# Patient Record
Sex: Female | Born: 1950 | Race: White | Hispanic: No | Marital: Married | Smoking: Never smoker
Health system: Southern US, Community
[De-identification: ages and names within clinical notes are randomized; demographics above are authoritative.]

## PROBLEM LIST (undated history)

## (undated) DIAGNOSIS — M1990 Unspecified osteoarthritis, unspecified site: Secondary | ICD-10-CM

## (undated) HISTORY — PX: APPENDECTOMY: SHX54

## (undated) HISTORY — PX: CHOLECYSTECTOMY: SHX55

---

## 2004-02-28 ENCOUNTER — Emergency Department: Payer: Self-pay | Admitting: Unknown Physician Specialty

## 2004-11-06 ENCOUNTER — Ambulatory Visit: Payer: Self-pay | Admitting: Internal Medicine

## 2006-04-01 ENCOUNTER — Ambulatory Visit: Payer: Self-pay | Admitting: Internal Medicine

## 2007-10-04 ENCOUNTER — Ambulatory Visit: Payer: Self-pay | Admitting: Internal Medicine

## 2007-10-05 ENCOUNTER — Ambulatory Visit: Payer: Self-pay | Admitting: Internal Medicine

## 2007-10-08 ENCOUNTER — Ambulatory Visit: Payer: Self-pay | Admitting: Internal Medicine

## 2007-11-21 ENCOUNTER — Other Ambulatory Visit: Payer: Self-pay

## 2007-11-21 ENCOUNTER — Emergency Department: Payer: Self-pay | Admitting: Emergency Medicine

## 2009-08-13 ENCOUNTER — Inpatient Hospital Stay: Payer: Self-pay | Admitting: Surgery

## 2015-10-28 ENCOUNTER — Emergency Department
Admission: EM | Admit: 2015-10-28 | Discharge: 2015-10-28 | Disposition: A | Discharge status: Home / Self Care | Payer: BLUE CROSS/BLUE SHIELD | Attending: Emergency Medicine | Admitting: Emergency Medicine

## 2015-10-28 ENCOUNTER — Encounter: Payer: Self-pay | Admitting: Emergency Medicine

## 2015-10-28 ENCOUNTER — Emergency Department: Payer: BLUE CROSS/BLUE SHIELD

## 2015-10-28 DIAGNOSIS — L03211 Cellulitis of face: Secondary | ICD-10-CM | POA: Diagnosis not present

## 2015-10-28 DIAGNOSIS — K0889 Other specified disorders of teeth and supporting structures: Secondary | ICD-10-CM | POA: Diagnosis present

## 2015-10-28 DIAGNOSIS — K047 Periapical abscess without sinus: Secondary | ICD-10-CM | POA: Insufficient documentation

## 2015-10-28 LAB — BASIC METABOLIC PANEL
Anion gap: 6 (ref 5–15)
BUN: 14 mg/dL (ref 6–20)
CALCIUM: 9 mg/dL (ref 8.9–10.3)
CHLORIDE: 106 mmol/L (ref 101–111)
CO2: 26 mmol/L (ref 22–32)
CREATININE: 0.71 mg/dL (ref 0.44–1.00)
GFR calc non Af Amer: >60 mL/min (ref >60)
Glucose, Bld: 143 mg/dL — ABNORMAL HIGH (ref 65–99)
Potassium: 4.4 mmol/L (ref 3.5–5.1)
SODIUM: 138 mmol/L (ref 135–145)

## 2015-10-28 LAB — CBC WITH DIFFERENTIAL/PLATELET
BASOS ABS: 0 10*3/uL (ref 0–0.1)
BASOS PCT: 0 %
EOS ABS: 0 10*3/uL (ref 0–0.7)
Eosinophils Relative: 0 %
HCT: 43.2 % (ref 35.0–47.0)
HEMOGLOBIN: 15.1 g/dL (ref 12.0–16.0)
LYMPHS ABS: 1.5 10*3/uL (ref 1.0–3.6)
Lymphocytes Relative: 13 %
MCH: 31.6 pg (ref 26.0–34.0)
MCHC: 34.9 g/dL (ref 32.0–36.0)
MCV: 90.6 fL (ref 80.0–100.0)
Monocytes Absolute: 0.8 10*3/uL (ref 0.2–0.9)
Monocytes Relative: 8 %
NEUTROS PCT: 79 %
Neutro Abs: 8.5 10*3/uL — ABNORMAL HIGH (ref 1.4–6.5)
Platelets: 192 10*3/uL (ref 150–440)
RBC: 4.77 MIL/uL (ref 3.80–5.20)
RDW: 13 % (ref 11.5–14.5)
WBC: 10.8 10*3/uL (ref 3.6–11.0)

## 2015-10-28 MED ORDER — CLINDAMYCIN PHOSPHATE 600 MG/50ML IV SOLN
600.0000 mg | Freq: Once | INTRAVENOUS | Status: AC
Start: 2015-10-28 — End: 2015-10-28
  Administered 2015-10-28: 600 mg via INTRAVENOUS
  Filled 2015-10-28: qty 50

## 2015-10-28 MED ORDER — HYDROCODONE-ACETAMINOPHEN 5-325 MG PO TABS: 1.0000 | ORAL_TABLET | Freq: Four times a day (QID) | ORAL | 0 refills | Status: DC | PRN

## 2015-10-28 MED ORDER — IOPAMIDOL (ISOVUE-300) INJECTION 61%: 75.0000 mL | Freq: Once | INTRAVENOUS | Status: DC | PRN

## 2015-10-28 MED ORDER — CLINDAMYCIN HCL 300 MG PO CAPS: 300.0000 mg | ORAL_CAPSULE | Freq: Three times a day (TID) | ORAL | 0 refills | Status: DC

## 2015-10-28 MED ORDER — ONDANSETRON HCL 4 MG/2ML IJ SOLN
4.0000 mg | INTRAMUSCULAR | Status: AC
  Administered 2015-10-28: 4 mg via INTRAVENOUS
  Filled 2015-10-28: qty 2

## 2015-10-28 MED ORDER — MORPHINE SULFATE (PF) 4 MG/ML IV SOLN
4.0000 mg | Freq: Once | INTRAVENOUS | Status: AC
  Administered 2015-10-28: 4 mg via INTRAVENOUS
  Filled 2015-10-28: qty 1

## 2015-10-28 MED ORDER — DEXAMETHASONE SODIUM PHOSPHATE 10 MG/ML IJ SOLN
10.0000 mg | Freq: Once | INTRAMUSCULAR | Status: AC
  Administered 2015-10-28: 10 mg via INTRAVENOUS
  Filled 2015-10-28: qty 1

## 2015-10-28 MED ORDER — SODIUM CHLORIDE 0.9 % IV BOLUS (SEPSIS)
500.0000 mL | INTRAVENOUS | Status: AC
  Administered 2015-10-28: 500 mL via INTRAVENOUS

## 2015-10-28 NOTE — ED Triage Notes (Signed)
Pt reports top right toothache pain with swelling and redness to right cheek; pt says she has broken teeth; pain started yesterday; pt provided with dental clinic/resources in the community in triage;

## 2015-10-28 NOTE — Discharge Instructions (Signed)
Please follow up with a dentist within 24-48 hours for recheck and treatment of your dental infection.  Call your doctor sooner or return to the ED if you develop worsening signs of infection such as: increased redness, increased pain, pus, fever, or other symptoms that concern you.

## 2015-10-28 NOTE — ED Provider Notes (Signed)
[facility] Emergency Department Provider Note  ____________________________________________   None    (approximate)  I have reviewed the triage vital signs and the nursing notes.   HISTORY  Chief Complaint Dental Pain    HPI Jessica Daugherty is a 65 y.o. female with a history of multiple broken teeth and poor dental hygiene (and no dentist) who presents with gradual onset but rapidly worsening pain in swelling in her right cheek that seems to be coming from one of her teeth on the right upper side.  She reports that it started about 24 hours ago and at first was relatively minor and mild but overnight has gotten significantly worse.  The right side of her face is visibly swollen and her cheek is red.  She also feels like her sinuses on the right side are bothering her and they feel full.  She describes the pain is severe and aching and throbbing.  Trying to eat makes it worse and nothing makes it better.  Her vision is not compromised but she is having some watering from her right eye.  There is no swelling around her eyes.  She denies fever/chills, chest pain, shortness of breath.  She has had some nausea but no vomiting.  She denies abdominal pain and dysuria.  Her voice has not changed and she is not having any trouble swallowing.  She has had no swelling of her tongue or her lips.   History reviewed. No pertinent past medical history.  There are no active problems to display for this patient.   Past Surgical History:  Procedure Laterality Date  . APPENDECTOMY    . CESAREAN SECTION    . CHOLECYSTECTOMY      Prior to Admission medications   Not on File    Allergies Review of patient's allergies indicates no known allergies.  History reviewed. No pertinent family history.  Social History Social History  Substance Use Topics  . Smoking status: Never Smoker  . Smokeless tobacco: Never Used  . Alcohol use No    Review of Systems Constitutional:  No fever/chills Eyes: No visual changes. Some watering of her right eye ENT: No sore throat. The sinuses and the right side of her face feel full and somewhat painful.  She has swelling and pain to her entire right cheek that is gotten significantly worse overnight and is tender to the touch and is coming from some dental problems in the right upper teeth Cardiovascular: Denies chest pain. Respiratory: Denies shortness of breath. Gastrointestinal: No abdominal pain.  nausea, no vomiting.  No diarrhea.  No constipation. Genitourinary: Negative for dysuria. Musculoskeletal: Negative for back pain. Skin: Negative for rash. Neurological: Negative for headaches, focal weakness or numbness.  10-point ROS otherwise negative.  ____________________________________________   PHYSICAL EXAM:  VITAL SIGNS: ED Triage Vitals  Enc Vitals Group     BP 10/28/15 0609 (!) 158/73     Pulse Rate 10/28/15 0609 93     Resp 10/28/15 0609 18     Temp 10/28/15 0609 98 F (36.7 C)     Temp Source 10/28/15 0609 Oral     SpO2 10/28/15 0609 95 %     Weight 10/28/15 0607 300 lb (136.1 kg)     Height 10/28/15 0607 5\' 3"  (1.6 m)     Head Circumference --      Peak Flow --      Pain Score 10/28/15 0607 10     Pain Loc --  Pain Edu? --      Excl. in GC? --     Constitutional: Alert and oriented. No acute distress. Eyes: Conjunctivae are normal except for some excessive lacrimation from the right eye. PERRL. EOMI. Head: Atraumatic. Nose: No congestion/rhinnorhea. Mouth/Throat: Numerous chronic dental caries throughout her mouth.  She has numerous missing teeth as well but to the best my estimation tooth #6 is the source of the issue; she has Some swelling on the external part of her gums and it is exquisitely tender to palpation.  Her right cheek is thickened, edematous, tender, and erythematous, all consistent with facial cellulitis at a minimum.  The tenderness extends in her face to her right bone but  she does not have any evidence of periorbital cellulitis.  Mucous membranes are moist.  Oropharynx non-erythematous.  No brawny induration under the mandible and no tenderness, thickening, or firmness under the tongue that would suggest Ludwig's angina. Neck: No stridor.  No meningeal signs.   Cardiovascular: Normal rate, regular rhythm. Good peripheral circulation. Grossly normal heart sounds. Respiratory: Normal respiratory effort.  No retractions. Lungs CTAB. Gastrointestinal: Soft and nontender. No distention.  Musculoskeletal: No lower extremity tenderness nor edema. No gross deformities of extremities. Neurologic:  Normal speech and language. No gross focal neurologic deficits are appreciated.  Skin:  Skin is warm, dry and intact. No rash noted. Psychiatric: Mood and affect are normal. Speech and behavior are normal.  ____________________________________________   LABS (all labs ordered are listed, but only abnormal results are displayed)  CBC with differential and BMP are pending  ____________________________________________  EKG  None - EKG not ordered by ED physician ____________________________________________  RADIOLOGY  CT maxillofacial with IV contrast is pending  ____________________________________________   PROCEDURES  Procedure(s) performed:   Procedures   Critical Care performed: No ____________________________________________   INITIAL IMPRESSION / ASSESSMENT AND PLAN / ED COURSE  Pertinent labs & imaging results that were available during my care of the patient were reviewed by me and considered in my medical decision making (see chart for details).  At a minimum the patient has facial cellulitis from a probable dental source, but I feel she needs further imaging to make sure she does not have a deep tissue infection/abscess.  I am treating empirically with clindamycin 600 mg IV and Decadron 10 mg IV and a small fluid bolus.  Transferring ED care to  Dr. Fanny BienQuale at 7:15am to follow up labs and CT scan to determine appropriate disposition.   ____________________________________________  FINAL CLINICAL IMPRESSION(S) / ED DIAGNOSES  Facial swelling, right cheek Probable facial cellulitis Multiple chronic dental caries   MEDICATIONS GIVEN DURING THIS VISIT:  Medications  clindamycin (CLEOCIN) IVPB 600 mg   dexamethasone (DECADRON) injection 10 mg   morphine 4 MG/ML injection 4 mg   ondansetron (ZOFRAN) injection 4 mg   sodium chloride 0.9 % bolus 500 mL         Note:  This document was prepared using Dragon voice recognition software and may include unintentional dictation errors.    Loleta Roseory Azaria Stegman, MD 10/28/15 [id number]

## 2015-10-28 NOTE — ED Notes (Signed)
Pt verbalized understanding of discharge instructions. NAD at this time. 

## 2015-10-28 NOTE — ED Notes (Signed)
Patient transported to CT 

## 2015-10-28 NOTE — ED Notes (Signed)
Per EDP direction acuity change

## 2015-10-28 NOTE — ED Provider Notes (Signed)
Ct Maxillofacial W Contrast  Result Date: 10/28/2015 CLINICAL DATA:  Right upper tooth pain.  Swelling and right cheek. EXAM: CT MAXILLOFACIAL WITH CONTRAST TECHNIQUE: Multidetector CT imaging of the maxillofacial structures was performed with intravenous contrast. Multiplanar CT image reconstructions were also generated. A small metallic BB was placed on the right temple in order to reliably differentiate right from left. CONTRAST:  75 cc Isovue 300 IV COMPARISON:  None. FINDINGS: Mild stranding in the right facial soft tissues compatible with cellulitis. No focal fluid collection to suggest soft tissue abscess. Mucosal thickening in the right maxillary sinus. Paranasal sinuses otherwise clear. Mastoid air cells are clear. Lucency noted around bilateral upper premolars and the right upper remaining molar compatible periapical abscess. Multiple dental caries. IMPRESSION: Periapical abscesses involving the upper premolars and remaining right upper molar. Multiple dental caries. Cellulitis changes in the right face without soft tissue abscess. Electronically Signed   By: Charlett NoseKevin  Dover M.D.   On: 10/28/2015 08:56     Patient resting comfortably. Airway widely patent. Reports pain is controlled, and we discussed treatment including follow-up options for dentistry. She'll be calling Monday to set up close follow-up and understands that she needs dental evaluation for a "abscess". All place her on clindamycin, also provided a prescription for hydrocodone. I will prescribe the patient a narcotic pain medicine due to their condition which I anticipate will cause at least moderate pain short term. I discussed with the patient safe use of narcotic pain medicines, and that they are not to drive, work in dangerous areas, or ever take more than prescribed (no more than 1 pill every 6 hours). We discussed that this is the type of medication that can be  overdosed on and the risks of this type of medicine. Patient is very  agreeable to only use as prescribed and to never use more than prescribed.  Alert, no distress, no evidence of deep infection such as Ludwig's. Patient appears appropriate for outpatient treatment. Return precautions and treatment recommendations and follow-up discussed with the patient who is agreeable with the plan.    Sharyn CreamerMark Shavette Shoaff, MD 10/28/15 [phone]

## 2016-04-11 NOTE — Discharge Instructions (Signed)
Cataract Surgery, Care After °Refer to this sheet in the next few weeks. These instructions provide you with information about caring for yourself after your procedure. Your health care provider may also give you more specific instructions. Your treatment has been planned according to current medical practices, but problems sometimes occur. Call your health care provider if you have any problems or questions after your procedure. °What can I expect after the procedure? °After the procedure, it is common to have: °· Itching. °· Discomfort. °· Fluid discharge. °· Sensitivity to light and to touch. °· Bruising. °Follow these instructions at home: °Eye Care  °· Check your eye every day for signs of infection. Watch for: °¨ Redness, swelling, or pain. °¨ Fluid, blood, or pus. °¨ Warmth. °¨ Bad smell. °Activity  °· Avoid strenuous activities, such as playing contact sports, for as long as told by your health care provider. °· Do not drive or operate heavy machinery until your health care provider approves. °· Do not bend or lift heavy objects . Bending increases pressure in the eye. You can walk, climb stairs, and do light household chores. °· Ask your health care provider when you can return to work. If you work in a dusty environment, you may be advised to wear protective eyewear for a period of time. °General instructions  °· Take or apply over-the-counter and prescription medicines only as told by your health care provider. This includes eye drops. °· Do not touch or rub your eyes. °· If you were given a protective shield, wear it as told by your health care provider. If you were not given a protective shield, wear sunglasses as told by your health care provider to protect your eyes. °· Keep the area around your eye clean and dry. Avoid swimming or allowing water to hit you directly in the face while showering until told by your health care provider. Keep soap and shampoo out of your eyes. °· Do not put a contact lens  into the affected eye or eyes until your health care provider approves. °· Keep all follow-up visits as told by your health care provider. This is important. °Contact a health care provider if: ° °· You have increased bruising around your eye. °· You have pain that is not helped with medicine. °· You have a fever. °· You have redness, swelling, or pain in your eye. °· You have fluid, blood, or pus coming from your incision. °· Your vision gets worse. °Get help right away if: °· You have sudden vision loss. °This information is not intended to replace advice given to you by your health care provider. Make sure you discuss any questions you have with your health care provider. °Document Released: 09/06/2004 Document Revised: 06/28/2015 Document Reviewed: 12/28/2014 °Elsevier Interactive Patient Education © 2017 [employer] ° ° ° ° °General Anesthesia, Adult, Care After °These instructions provide you with information about caring for yourself after your procedure. Your health care provider may also give you more specific instructions. Your treatment has been planned according to current medical practices, but problems sometimes occur. Call your health care provider if you have any problems or questions after your procedure. °What can I expect after the procedure? °After the procedure, it is common to have: °· Vomiting. °· A sore throat. °· Mental slowness. °It is common to feel: °· Nauseous. °· Cold or shivery. °· Sleepy. °· Tired. °· Sore or achy, even in parts of your body where you did not have surgery. °Follow these instructions at   home: °For at least 24 hours after the procedure:  °· Do not: °¨ Participate in activities where you could fall or become injured. °¨ Drive. °¨ Use heavy machinery. °¨ Drink alcohol. °¨ Take sleeping pills or medicines that cause drowsiness. °¨ Make important decisions or sign legal documents. °¨ Take care of children on your own. °· Rest. °Eating and drinking  °· If you vomit, drink  water, juice, or soup when you can drink without vomiting. °· Drink enough fluid to keep your urine clear or pale yellow. °· Make sure you have little or no nausea before eating solid foods. °· Follow the diet recommended by your health care provider. °General instructions  °· Have a responsible adult stay with you until you are awake and alert. °· Return to your normal activities as told by your health care provider. Ask your health care provider what activities are safe for you. °· Take over-the-counter and prescription medicines only as told by your health care provider. °· If you smoke, do not smoke without supervision. °· Keep all follow-up visits as told by your health care provider. This is important. °Contact a health care provider if: °· You continue to have nausea or vomiting at home, and medicines are not helpful. °· You cannot drink fluids or start eating again. °· You cannot urinate after 8-12 hours. °· You develop a skin rash. °· You have fever. °· You have increasing redness at the site of your procedure. °Get help right away if: °· You have difficulty breathing. °· You have chest pain. °· You have unexpected bleeding. °· You feel that you are having a life-threatening or urgent problem. °This information is not intended to replace advice given to you by your health care provider. Make sure you discuss any questions you have with your health care provider. °Document Released: 05/26/2000 Document Revised: 07/23/2015 Document Reviewed: 02/01/2015 °Elsevier Interactive Patient Education © 2017 [employer] ° °

## 2016-04-14 ENCOUNTER — Encounter: Payer: Self-pay | Admitting: *Deleted

## 2016-04-16 ENCOUNTER — Ambulatory Visit: Payer: Medicare PPO | Admitting: Anesthesiology

## 2016-04-16 ENCOUNTER — Ambulatory Visit
Admission: RE | Admit: 2016-04-16 | Discharge: 2016-04-16 | Disposition: A | Discharge status: Home / Self Care | Payer: Medicare PPO | Source: Ambulatory Visit | Attending: Ophthalmology | Admitting: Ophthalmology

## 2016-04-16 ENCOUNTER — Encounter
Admission: RE | Disposition: A | Discharge status: Home / Self Care | Payer: Self-pay | Source: Ambulatory Visit | Attending: Ophthalmology

## 2016-04-16 DIAGNOSIS — H2511 Age-related nuclear cataract, right eye: Secondary | ICD-10-CM | POA: Insufficient documentation

## 2016-04-16 HISTORY — DX: Unspecified osteoarthritis, unspecified site: M19.90

## 2016-04-16 HISTORY — PX: CATARACT EXTRACTION W/PHACO: SHX586

## 2016-04-16 SURGERY — PHACOEMULSIFICATION, CATARACT, WITH IOL INSERTION
Anesthesia: Monitor Anesthesia Care | Site: Eye | Laterality: Right | Wound class: Clean

## 2016-04-16 MED ORDER — FENTANYL CITRATE (PF) 100 MCG/2ML IJ SOLN
INTRAMUSCULAR | Status: DC | PRN
  Administered 2016-04-16: 50 ug via INTRAVENOUS

## 2016-04-16 MED ORDER — LACTATED RINGERS IV SOLN: 500.0000 mL | INTRAVENOUS | Status: DC

## 2016-04-16 MED ORDER — ACETAMINOPHEN 160 MG/5ML PO SOLN: 325.0000 mg | ORAL | Status: DC | PRN

## 2016-04-16 MED ORDER — CEFUROXIME OPHTHALMIC INJECTION 1 MG/0.1 ML
INJECTION | OPHTHALMIC | Status: DC | PRN
  Administered 2016-04-16: .3 mL via OPHTHALMIC

## 2016-04-16 MED ORDER — MIDAZOLAM HCL 2 MG/2ML IJ SOLN
INTRAMUSCULAR | Status: DC | PRN
Start: 2016-04-16 — End: 2016-04-16
  Administered 2016-04-16: 2 mg via INTRAVENOUS

## 2016-04-16 MED ORDER — NA HYALUR & NA CHOND-NA HYALUR 0.4-0.35 ML IO KIT
PACK | INTRAOCULAR | Status: DC | PRN
  Administered 2016-04-16: 1 mL via INTRAOCULAR

## 2016-04-16 MED ORDER — ACETAMINOPHEN 325 MG PO TABS: 325.0000 mg | ORAL_TABLET | ORAL | Status: DC | PRN

## 2016-04-16 MED ORDER — MOXIFLOXACIN HCL 0.5 % OP SOLN
1.0000 [drp] | OPHTHALMIC | Status: DC | PRN
  Administered 2016-04-16 (×3): 1 [drp] via OPHTHALMIC

## 2016-04-16 MED ORDER — EPINEPHRINE PF 1 MG/ML IJ SOLN
INTRAMUSCULAR | Status: DC | PRN
  Administered 2016-04-16: 63 mL via OPHTHALMIC

## 2016-04-16 MED ORDER — BRIMONIDINE TARTRATE-TIMOLOL 0.2-0.5 % OP SOLN
OPHTHALMIC | Status: DC | PRN
  Administered 2016-04-16: 1 [drp] via OPHTHALMIC

## 2016-04-16 MED ORDER — ARMC OPHTHALMIC DILATING DROPS
1.0000 | OPHTHALMIC | Status: DC | PRN
Start: 2016-04-16 — End: 2016-04-16
  Administered 2016-04-16 (×3): 1 via OPHTHALMIC

## 2016-04-16 MED ORDER — LIDOCAINE HCL (PF) 2 % IJ SOLN
INTRAMUSCULAR | Status: DC | PRN
  Administered 2016-04-16: 2 mL via INTRAOCULAR

## 2016-04-16 MED ORDER — LACTATED RINGERS IV SOLN: INTRAVENOUS | Status: DC

## 2016-04-16 SURGICAL SUPPLY — 25 items
CANNULA ANT/CHMB 27GA (MISCELLANEOUS) ×3 IMPLANT
CARTRIDGE ABBOTT (MISCELLANEOUS) IMPLANT
GLOVE SURG LX 7.5 STRW (GLOVE) ×2
GLOVE SURG LX STRL 7.5 STRW (GLOVE) ×1 IMPLANT
GLOVE SURG TRIUMPH 8.0 PF LTX (GLOVE) ×3 IMPLANT
GOWN STRL REUS W/ TWL LRG LVL3 (GOWN DISPOSABLE) ×2 IMPLANT
GOWN STRL REUS W/TWL LRG LVL3 (GOWN DISPOSABLE) ×4
LENS IOL TECNIS ITEC 15.0 (Intraocular Lens) ×3 IMPLANT
MARKER SKIN DUAL TIP RULER LAB (MISCELLANEOUS) ×3 IMPLANT
NDL RETROBULBAR .5 NSTRL (NEEDLE) IMPLANT
NEEDLE FILTER BLUNT 18X 1/2SAF (NEEDLE) ×2
NEEDLE FILTER BLUNT 18X1 1/2 (NEEDLE) ×1 IMPLANT
PACK CATARACT BRASINGTON (MISCELLANEOUS) ×3 IMPLANT
PACK EYE AFTER SURG (MISCELLANEOUS) ×3 IMPLANT
PACK OPTHALMIC (MISCELLANEOUS) ×3 IMPLANT
RING MALYGIN 7.0 (MISCELLANEOUS) IMPLANT
SUT ETHILON 10-0 CS-B-6CS-B-6 (SUTURE)
SUT VICRYL  9 0 (SUTURE)
SUT VICRYL 9 0 (SUTURE) IMPLANT
SUTURE EHLN 10-0 CS-B-6CS-B-6 (SUTURE) IMPLANT
SYR 3ML LL SCALE MARK (SYRINGE) ×3 IMPLANT
SYR 5ML LL (SYRINGE) ×3 IMPLANT
SYR TB 1ML LUER SLIP (SYRINGE) ×3 IMPLANT
WATER STERILE IRR 250ML POUR (IV SOLUTION) ×3 IMPLANT
WIPE NON LINTING 3.25X3.25 (MISCELLANEOUS) ×3 IMPLANT

## 2016-04-16 NOTE — Op Note (Signed)
LOCATION:  [facility]   PREOPERATIVE DIAGNOSIS:    Nuclear sclerotic cataract right eye. H25.11   POSTOPERATIVE DIAGNOSIS:  Nuclear sclerotic cataract right eye.     PROCEDURE:  Phacoemusification with posterior chamber intraocular lens placement of the right eye   LENS:   Implant Name Type Inv. Item Serial No. Manufacturer Lot No. LRB No. Used  LENS IOL DIOP 15.0 - [id number] Intraocular Lens LENS IOL DIOP 15.0 [id number]   Right 1        ULTRASOUND TIME: 17 % of 1 minutes, 6 seconds.  CDE 11.6   SURGEON:  Deirdre Evenerhadwick R. Junior Huezo, MD   ANESTHESIA:  Topical with tetracaine drops and 2% Xylocaine jelly, augmented with 1% preservative-free intracameral lidocaine.    COMPLICATIONS:  None.   DESCRIPTION OF PROCEDURE:  The patient was identified in the holding room and transported to the operating room and placed in the supine position under the operating microscope.  The right eye was identified as the operative eye and it was prepped and draped in the usual sterile ophthalmic fashion.   A 1 millimeter clear-corneal paracentesis was made at the 12:00 position.  0.5 ml of preservative-free 1% lidocaine was injected into the anterior chamber. The anterior chamber was filled with Viscoat viscoelastic.  A 2.4 millimeter keratome was used to make a near-clear corneal incision at the 9:00 position.  A curvilinear capsulorrhexis was made with a cystotome and capsulorrhexis forceps.  Balanced salt solution was used to hydrodissect and hydrodelineate the nucleus.   Phacoemulsification was then used in stop and chop fashion to remove the lens nucleus and epinucleus.  The remaining cortex was then removed using the irrigation and aspiration handpiece. Provisc was then placed into the capsular bag to distend it for lens placement.  A lens was then injected into the capsular bag.  The remaining viscoelastic was aspirated.   Wounds were hydrated with balanced salt solution.  The anterior  chamber was inflated to a physiologic pressure with balanced salt solution.  No wound leaks were noted. Cefuroxime 0.1 ml of a 10mg /ml solution was injected into the anterior chamber for a dose of 1 mg of intracameral antibiotic at the completion of the case.   Timolol and Brimonidine drops were applied to the eye.  The patient was taken to the recovery room in stable condition without complications of anesthesia or surgery.   Jessica Daugherty 04/16/2016, 10:16 AM

## 2016-04-16 NOTE — Anesthesia Postprocedure Evaluation (Signed)
Anesthesia Post Note  Patient: Jessica Daugherty  Procedure(s) Performed: Procedure(s) (LRB): CATARACT EXTRACTION PHACO AND INTRAOCULAR LENS PLACEMENT (IOC)  Right (Right)  Patient location during evaluation: PACU Anesthesia Type: MAC Level of consciousness: awake and alert Pain management: pain level controlled Vital Signs Assessment: post-procedure vital signs reviewed and stable Respiratory status: spontaneous breathing, nonlabored ventilation and respiratory function stable Cardiovascular status: stable and blood pressure returned to baseline Anesthetic complications: no    Pearlena Ow D Steph Cheadle

## 2016-04-16 NOTE — H&P (Signed)
The History and Physical notes are on paper, have been signed, and are to be scanned. The patient remains stable and unchanged from the H&P.   Previous H&P reviewed, patient examined, and there are no changes.  Jessica Daugherty 04/16/2016 9:13 AM\

## 2016-04-16 NOTE — Anesthesia Preprocedure Evaluation (Signed)
Anesthesia Evaluation  Patient identified by MRN, date of birth, ID band Patient awake    Reviewed: Allergy & Precautions, H&P , NPO status , Patient's Chart, lab work & pertinent test results, reviewed documented beta blocker date and time   Airway Mallampati: II  TM Distance: >3 FB Neck ROM: full    Dental no notable dental hx.    Pulmonary neg pulmonary ROS,    Pulmonary exam normal breath sounds clear to auscultation       Cardiovascular Exercise Tolerance: Good negative cardio ROS   Rhythm:regular Rate:Normal     Neuro/Psych negative neurological ROS  negative psych ROS   GI/Hepatic negative GI ROS, Neg liver ROS,   Endo/Other  negative endocrine ROS  Renal/GU negative Renal ROS  negative genitourinary   Musculoskeletal   Abdominal   Peds  Hematology negative hematology ROS (+)   Anesthesia Other Findings   Reproductive/Obstetrics negative OB ROS                             Anesthesia Physical Anesthesia Plan  ASA: II  Anesthesia Plan: MAC   Post-op Pain Management:    Induction:   Airway Management Planned:   Additional Equipment:   Intra-op Plan:   Post-operative Plan:   Informed Consent:   Plan Discussed with: CRNA  Anesthesia Plan Comments:         Anesthesia Quick Evaluation  

## 2016-04-16 NOTE — Transfer of Care (Signed)
Immediate Anesthesia Transfer of Care Note  Patient: Jessica Daugherty  Procedure(s) Performed: Procedure(s): CATARACT EXTRACTION PHACO AND INTRAOCULAR LENS PLACEMENT (IOC)  Right (Right)  Patient Location: PACU  Anesthesia Type: MAC  Level of Consciousness: awake, alert  and patient cooperative  Airway and Oxygen Therapy: Patient Spontanous Breathing and Patient connected to supplemental oxygen  Post-op Assessment: Post-op Vital signs reviewed, Patient's Cardiovascular Status Stable, Respiratory Function Stable, Patent Airway and No signs of Nausea or vomiting  Post-op Vital Signs: Reviewed and stable  Complications: No apparent anesthesia complications

## 2016-04-16 NOTE — Anesthesia Procedure Notes (Signed)
Procedure Name: MAC Performed by: Mayme Genta Pre-anesthesia Checklist: Patient identified, Emergency Drugs available, Suction available, Timeout performed and Patient being monitored Patient Re-evaluated:Patient Re-evaluated prior to inductionOxygen Delivery Method: Nasal cannula Placement Confirmation: positive ETCO2

## 2016-04-17 ENCOUNTER — Encounter: Payer: Self-pay | Admitting: Ophthalmology

## 2016-05-02 ENCOUNTER — Encounter: Payer: Self-pay | Admitting: *Deleted

## 2016-05-02 NOTE — Discharge Instructions (Signed)
Cataract Surgery, Care After °Refer to this sheet in the next few weeks. These instructions provide you with information about caring for yourself after your procedure. Your health care provider may also give you more specific instructions. Your treatment has been planned according to current medical practices, but problems sometimes occur. Call your health care provider if you have any problems or questions after your procedure. °What can I expect after the procedure? °After the procedure, it is common to have: °· Itching. °· Discomfort. °· Fluid discharge. °· Sensitivity to light and to touch. °· Bruising. °Follow these instructions at home: °Eye Care  °· Check your eye every day for signs of infection. Watch for: °¨ Redness, swelling, or pain. °¨ Fluid, blood, or pus. °¨ Warmth. °¨ Bad smell. °Activity  °· Avoid strenuous activities, such as playing contact sports, for as long as told by your health care provider. °· Do not drive or operate heavy machinery until your health care provider approves. °· Do not bend or lift heavy objects . Bending increases pressure in the eye. You can walk, climb stairs, and do light household chores. °· Ask your health care provider when you can return to work. If you work in a dusty environment, you may be advised to wear protective eyewear for a period of time. °General instructions  °· Take or apply over-the-counter and prescription medicines only as told by your health care provider. This includes eye drops. °· Do not touch or rub your eyes. °· If you were given a protective shield, wear it as told by your health care provider. If you were not given a protective shield, wear sunglasses as told by your health care provider to protect your eyes. °· Keep the area around your eye clean and dry. Avoid swimming or allowing water to hit you directly in the face while showering until told by your health care provider. Keep soap and shampoo out of your eyes. °· Do not put a contact lens  into the affected eye or eyes until your health care provider approves. °· Keep all follow-up visits as told by your health care provider. This is important. °Contact a health care provider if: ° °· You have increased bruising around your eye. °· You have pain that is not helped with medicine. °· You have a fever. °· You have redness, swelling, or pain in your eye. °· You have fluid, blood, or pus coming from your incision. °· Your vision gets worse. °Get help right away if: °· You have sudden vision loss. °This information is not intended to replace advice given to you by your health care provider. Make sure you discuss any questions you have with your health care provider. °Document Released: 09/06/2004 Document Revised: 06/28/2015 Document Reviewed: 12/28/2014 °Elsevier Interactive Patient Education © 2017 [employer] ° ° ° ° °General Anesthesia, Adult, Care After °These instructions provide you with information about caring for yourself after your procedure. Your health care provider may also give you more specific instructions. Your treatment has been planned according to current medical practices, but problems sometimes occur. Call your health care provider if you have any problems or questions after your procedure. °What can I expect after the procedure? °After the procedure, it is common to have: °· Vomiting. °· A sore throat. °· Mental slowness. °It is common to feel: °· Nauseous. °· Cold or shivery. °· Sleepy. °· Tired. °· Sore or achy, even in parts of your body where you did not have surgery. °Follow these instructions at   home: °For at least 24 hours after the procedure:  °· Do not: °¨ Participate in activities where you could fall or become injured. °¨ Drive. °¨ Use heavy machinery. °¨ Drink alcohol. °¨ Take sleeping pills or medicines that cause drowsiness. °¨ Make important decisions or sign legal documents. °¨ Take care of children on your own. °· Rest. °Eating and drinking  °· If you vomit, drink  water, juice, or soup when you can drink without vomiting. °· Drink enough fluid to keep your urine clear or pale yellow. °· Make sure you have little or no nausea before eating solid foods. °· Follow the diet recommended by your health care provider. °General instructions  °· Have a responsible adult stay with you until you are awake and alert. °· Return to your normal activities as told by your health care provider. Ask your health care provider what activities are safe for you. °· Take over-the-counter and prescription medicines only as told by your health care provider. °· If you smoke, do not smoke without supervision. °· Keep all follow-up visits as told by your health care provider. This is important. °Contact a health care provider if: °· You continue to have nausea or vomiting at home, and medicines are not helpful. °· You cannot drink fluids or start eating again. °· You cannot urinate after 8-12 hours. °· You develop a skin rash. °· You have fever. °· You have increasing redness at the site of your procedure. °Get help right away if: °· You have difficulty breathing. °· You have chest pain. °· You have unexpected bleeding. °· You feel that you are having a life-threatening or urgent problem. °This information is not intended to replace advice given to you by your health care provider. Make sure you discuss any questions you have with your health care provider. °Document Released: 05/26/2000 Document Revised: 07/23/2015 Document Reviewed: 02/01/2015 °Elsevier Interactive Patient Education © 2017 [employer] ° °

## 2016-05-07 ENCOUNTER — Ambulatory Visit
Admission: RE | Admit: 2016-05-07 | Discharge: 2016-05-07 | Disposition: A | Discharge status: Home / Self Care | Payer: Medicare PPO | Source: Ambulatory Visit | Attending: Ophthalmology | Admitting: Ophthalmology

## 2016-05-07 ENCOUNTER — Encounter
Admission: RE | Disposition: A | Discharge status: Home / Self Care | Payer: Self-pay | Source: Ambulatory Visit | Attending: Ophthalmology

## 2016-05-07 ENCOUNTER — Ambulatory Visit: Payer: Medicare PPO | Admitting: Anesthesiology

## 2016-05-07 DIAGNOSIS — H2512 Age-related nuclear cataract, left eye: Secondary | ICD-10-CM | POA: Diagnosis present

## 2016-05-07 HISTORY — PX: CATARACT EXTRACTION W/PHACO: SHX586

## 2016-05-07 SURGERY — PHACOEMULSIFICATION, CATARACT, WITH IOL INSERTION
Anesthesia: Monitor Anesthesia Care | Site: Eye | Laterality: Left | Wound class: Clean

## 2016-05-07 MED ORDER — MIDAZOLAM HCL 2 MG/2ML IJ SOLN
INTRAMUSCULAR | Status: DC | PRN
  Administered 2016-05-07: 2 mg via INTRAVENOUS

## 2016-05-07 MED ORDER — ARMC OPHTHALMIC DILATING DROPS
1.0000 "application " | OPHTHALMIC | Status: DC | PRN
  Administered 2016-05-07 (×3): 1 via OPHTHALMIC

## 2016-05-07 MED ORDER — MOXIFLOXACIN HCL 0.5 % OP SOLN
1.0000 [drp] | OPHTHALMIC | Status: DC | PRN
  Administered 2016-05-07 (×3): 1 [drp] via OPHTHALMIC

## 2016-05-07 MED ORDER — EPINEPHRINE PF 1 MG/ML IJ SOLN
INTRAOCULAR | Status: DC | PRN
  Administered 2016-05-07: 70 mL via OPHTHALMIC

## 2016-05-07 MED ORDER — FENTANYL CITRATE (PF) 100 MCG/2ML IJ SOLN
INTRAMUSCULAR | Status: DC | PRN
  Administered 2016-05-07: 100 ug via INTRAVENOUS

## 2016-05-07 MED ORDER — LIDOCAINE HCL (PF) 2 % IJ SOLN
INTRAOCULAR | Status: DC | PRN
  Administered 2016-05-07: 1 mL via INTRAOCULAR

## 2016-05-07 MED ORDER — ACETAMINOPHEN 160 MG/5ML PO SOLN: 325.0000 mg | ORAL | Status: DC | PRN

## 2016-05-07 MED ORDER — CEFUROXIME OPHTHALMIC INJECTION 1 MG/0.1 ML
INJECTION | OPHTHALMIC | Status: DC | PRN
  Administered 2016-05-07: .3 mL via OPHTHALMIC

## 2016-05-07 MED ORDER — NA HYALUR & NA CHOND-NA HYALUR 0.4-0.35 ML IO KIT
PACK | INTRAOCULAR | Status: DC | PRN
  Administered 2016-05-07: 1 mL via INTRAOCULAR

## 2016-05-07 MED ORDER — ACETAMINOPHEN 325 MG PO TABS: 325.0000 mg | ORAL_TABLET | ORAL | Status: DC | PRN

## 2016-05-07 MED ORDER — BRIMONIDINE TARTRATE-TIMOLOL 0.2-0.5 % OP SOLN
OPHTHALMIC | Status: DC | PRN
  Administered 2016-05-07: 1 [drp] via OPHTHALMIC

## 2016-05-07 SURGICAL SUPPLY — 25 items
CANNULA ANT/CHMB 27GA (MISCELLANEOUS) ×3 IMPLANT
CARTRIDGE ABBOTT (MISCELLANEOUS) IMPLANT
GLOVE SURG LX 7.5 STRW (GLOVE) ×2
GLOVE SURG LX STRL 7.5 STRW (GLOVE) ×1 IMPLANT
GLOVE SURG TRIUMPH 8.0 PF LTX (GLOVE) ×3 IMPLANT
GOWN STRL REUS W/ TWL LRG LVL3 (GOWN DISPOSABLE) ×2 IMPLANT
GOWN STRL REUS W/TWL LRG LVL3 (GOWN DISPOSABLE) ×4
LENS IOL TECNIS ITEC 15.5 (Intraocular Lens) ×3 IMPLANT
MARKER SKIN DUAL TIP RULER LAB (MISCELLANEOUS) ×3 IMPLANT
NDL RETROBULBAR .5 NSTRL (NEEDLE) IMPLANT
NEEDLE FILTER BLUNT 18X 1/2SAF (NEEDLE) ×2
NEEDLE FILTER BLUNT 18X1 1/2 (NEEDLE) ×1 IMPLANT
PACK CATARACT BRASINGTON (MISCELLANEOUS) ×3 IMPLANT
PACK EYE AFTER SURG (MISCELLANEOUS) ×3 IMPLANT
PACK OPTHALMIC (MISCELLANEOUS) ×3 IMPLANT
RING MALYGIN 7.0 (MISCELLANEOUS) IMPLANT
SUT ETHILON 10-0 CS-B-6CS-B-6 (SUTURE)
SUT VICRYL  9 0 (SUTURE)
SUT VICRYL 9 0 (SUTURE) IMPLANT
SUTURE EHLN 10-0 CS-B-6CS-B-6 (SUTURE) IMPLANT
SYR 3ML LL SCALE MARK (SYRINGE) ×3 IMPLANT
SYR 5ML LL (SYRINGE) ×3 IMPLANT
SYR TB 1ML LUER SLIP (SYRINGE) ×3 IMPLANT
WATER STERILE IRR 250ML POUR (IV SOLUTION) ×3 IMPLANT
WIPE NON LINTING 3.25X3.25 (MISCELLANEOUS) ×3 IMPLANT

## 2016-05-07 NOTE — H&P (Signed)
The History and Physical notes are on paper, have been signed, and are to be scanned. The patient remains stable and unchanged from the H&P.   Previous H&P reviewed, patient examined, and there are no changes.  Jessica Daugherty 05/07/2016 9:43 AM

## 2016-05-07 NOTE — Op Note (Signed)
OPERATIVE NOTE  Jessica KendallSandra Daugherty [id number] 05/07/2016   PREOPERATIVE DIAGNOSIS:  Nuclear sclerotic cataract left eye. H25.12   POSTOPERATIVE DIAGNOSIS:    Nuclear sclerotic cataract left eye.     PROCEDURE:  Phacoemusification with posterior chamber intraocular lens placement of the left eye   LENS:   Implant Name Type Inv. Item Serial No. Manufacturer Lot No. LRB No. Used  LENS IOL DIOP 15.5 - [id number] Intraocular Lens LENS IOL DIOP 15.5 [id number] AMO  Left 1  LENS IOL DIOP 15.5 - [id number] Intraocular Lens LENS IOL DIOP 15.5 [id number]   Left 1     1st IOL did not extrude through cartridge.  2nd IOL used.   ULTRASOUND TIME: 13  % of 0 minutes 58 seconds, CDE 7.3  SURGEON:  Deirdre Evenerhadwick R. Daesha Insco, MD   ANESTHESIA:  Topical with tetracaine drops and 2% Xylocaine jelly, augmented with 1% preservative-free intracameral lidocaine.    COMPLICATIONS:  None.   DESCRIPTION OF PROCEDURE:  The patient was identified in the holding room and transported to the operating room and placed in the supine position under the operating microscope.  The left eye was identified as the operative eye and it was prepped and draped in the usual sterile ophthalmic fashion.   A 1 millimeter clear-corneal paracentesis was made at the 1:30 position.  0.5 ml of preservative-free 1% lidocaine was injected into the anterior chamber.  The anterior chamber was filled with Viscoat viscoelastic.  A 2.4 millimeter keratome was used to make a near-clear corneal incision at the 10:30 position.  .  A curvilinear capsulorrhexis was made with a cystotome and capsulorrhexis forceps.  Balanced salt solution was used to hydrodissect and hydrodelineate the nucleus.   Phacoemulsification was then used in stop and chop fashion to remove the lens nucleus and epinucleus.  The remaining cortex was then removed using the irrigation and aspiration handpiece. Provisc was then placed into the capsular bag to distend it for lens  placement.  A lens was then injected into the capsular bag.  The remaining viscoelastic was aspirated.   Wounds were hydrated with balanced salt solution.  The anterior chamber was inflated to a physiologic pressure with balanced salt solution.  No wound leaks were noted. Cefuroxime 0.1 ml of a 10mg /ml solution was injected into the anterior chamber for a dose of 1 mg of intracameral antibiotic at the completion of the case.   Timolol and Brimonidine drops were applied to the eye.  The patient was taken to the recovery room in stable condition without complications of anesthesia or surgery.  [name] 05/07/2016, 10:48 AM

## 2016-05-07 NOTE — Transfer of Care (Signed)
Immediate Anesthesia Transfer of Care Note  Patient: Jessica Daugherty  Procedure(s) Performed: Procedure(s): CATARACT EXTRACTION PHACO AND INTRAOCULAR LENS PLACEMENT ([place])  left eye (Left)  Patient Location: PACU  Anesthesia Type: MAC  Level of Consciousness: awake, alert  and patient cooperative  Airway and Oxygen Therapy: Patient Spontanous Breathing and Patient connected to supplemental oxygen  Post-op Assessment: Post-op Vital signs reviewed, Patient's Cardiovascular Status Stable, Respiratory Function Stable, Patent Airway and No signs of Nausea or vomiting  Post-op Vital Signs: Reviewed and stable  Complications: No apparent anesthesia complications

## 2016-05-07 NOTE — Anesthesia Preprocedure Evaluation (Signed)
Anesthesia Evaluation  Patient identified by MRN, date of birth, ID band Patient awake    Reviewed: Allergy & Precautions, H&P , NPO status , Patient's Chart, lab work & pertinent test results, reviewed documented beta blocker date and time   Airway Mallampati: II  TM Distance: >3 FB Neck ROM: full    Dental no notable dental hx.    Pulmonary neg pulmonary ROS,    Pulmonary exam normal breath sounds clear to auscultation       Cardiovascular Exercise Tolerance: Good negative cardio ROS   Rhythm:regular Rate:Normal     Neuro/Psych negative neurological ROS  negative psych ROS   GI/Hepatic negative GI ROS, Neg liver ROS,   Endo/Other  negative endocrine ROS  Renal/GU negative Renal ROS  negative genitourinary   Musculoskeletal   Abdominal   Peds  Hematology negative hematology ROS (+)   Anesthesia Other Findings   Reproductive/Obstetrics negative OB ROS                             Anesthesia Physical Anesthesia Plan  ASA: II  Anesthesia Plan: MAC   Post-op Pain Management:    Induction:   Airway Management Planned:   Additional Equipment:   Intra-op Plan:   Post-operative Plan:   Informed Consent: I have reviewed the patients History and Physical, chart, labs and discussed the procedure including the risks, benefits and alternatives for the proposed anesthesia with the patient or authorized representative who has indicated his/her understanding and acceptance.   Dental Advisory Given  Plan Discussed with: CRNA  Anesthesia Plan Comments:         Anesthesia Quick Evaluation  

## 2016-05-07 NOTE — Anesthesia Postprocedure Evaluation (Signed)
Anesthesia Post Note  Patient: Jessica Daugherty  Procedure(s) Performed: Procedure(s) (LRB): CATARACT EXTRACTION PHACO AND INTRAOCULAR LENS PLACEMENT ([place])  left eye (Left)  Patient location during evaluation: PACU Anesthesia Type: MAC Level of consciousness: awake and alert Pain management: pain level controlled Vital Signs Assessment: post-procedure vital signs reviewed and stable Respiratory status: spontaneous breathing, nonlabored ventilation, respiratory function stable and patient connected to nasal cannula oxygen Cardiovascular status: stable and blood pressure returned to baseline Anesthetic complications: no    Alisa Graff

## 2016-05-08 ENCOUNTER — Encounter: Payer: Self-pay | Admitting: Ophthalmology

## 2017-05-22 IMAGING — CT CT MAXILLOFACIAL W/ CM
3 of 4 series · 15 of 47 positions shown, 18 images · IV contrast (isovue)
Comparison: None.

CLINICAL DATA: Right upper tooth pain.  Swelling and right cheek.

EXAM:
CT MAXILLOFACIAL WITH CONTRAST
TECHNIQUE: Multidetector CT imaging of the maxillofacial structures was
performed with intravenous contrast. Multiplanar CT image
reconstructions were also generated. A small metallic BB was placed
on the right temple in order to reliably differentiate right from
left.
CONTRAST:  75 cc Isovue 300 IV

[Series 2: max soft · axial · 0.35mm/px · z∈[+278,+406]mm · 9 of 78 slices shown, 12 images]
[im 9/78  brain]
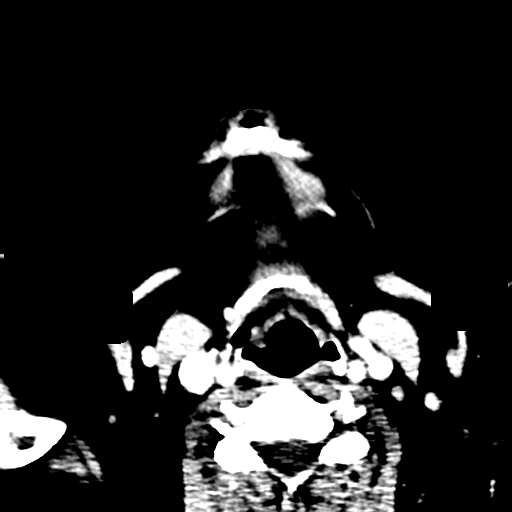
[im 9/78  bone]
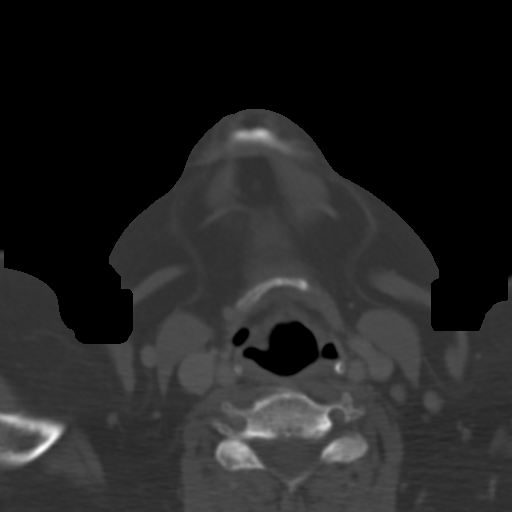
[im 17/78  bone]
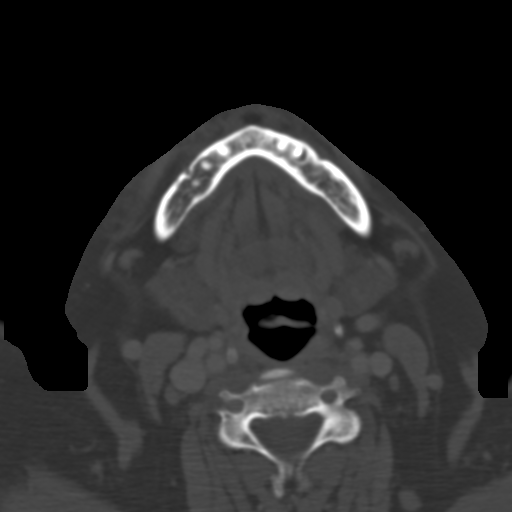
[im 25/78  bone]
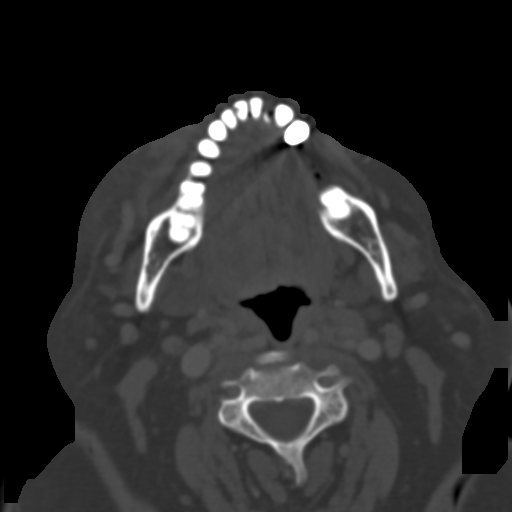
[im 33/78  bone]
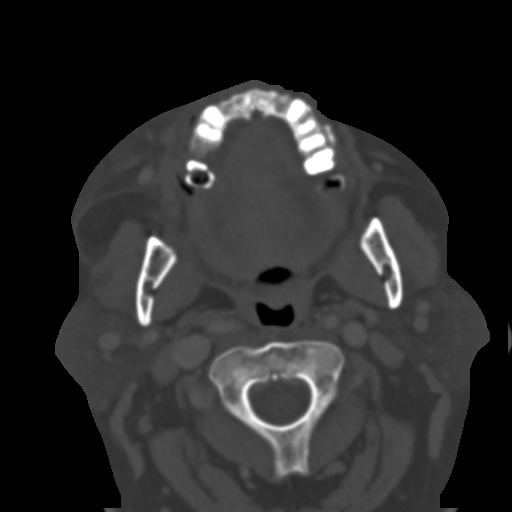
[im 41/78  brain]
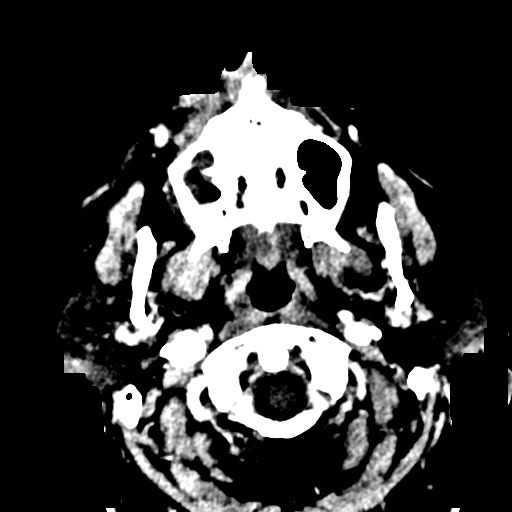
[im 41/78  bone]
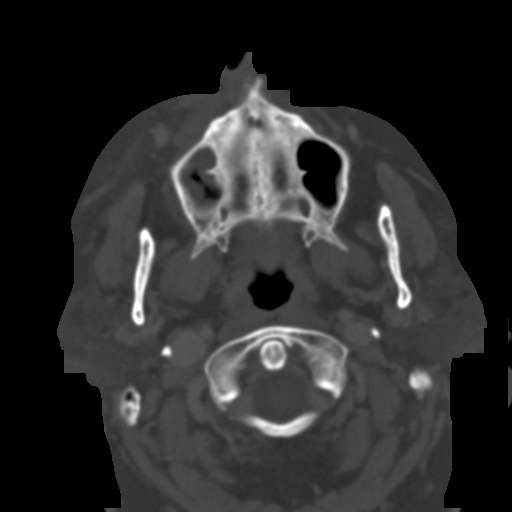
[im 49/78  bone]
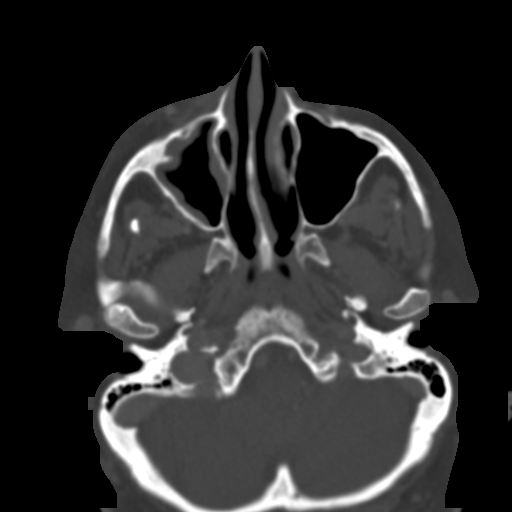
[im 57/78  bone]
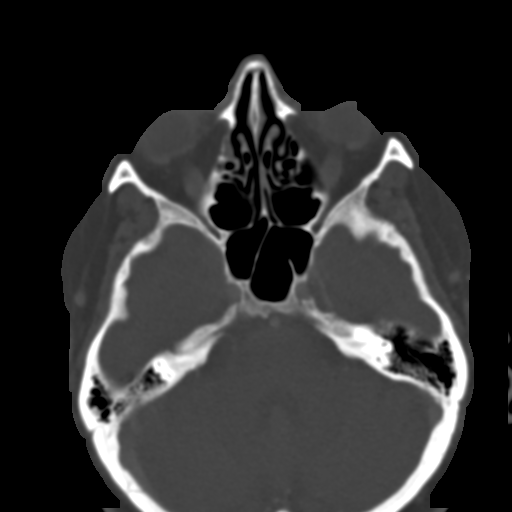
[im 65/78  bone]
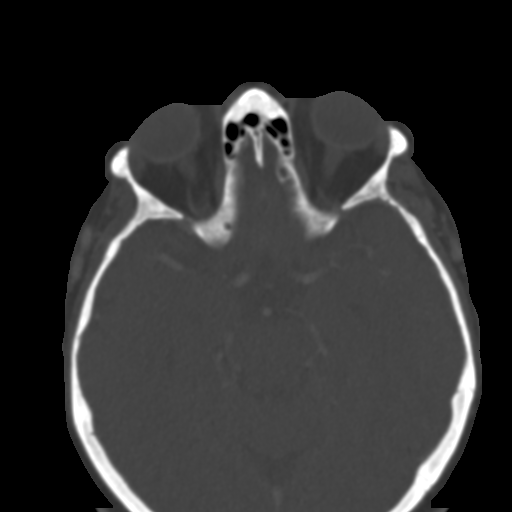
[im 73/78  brain]
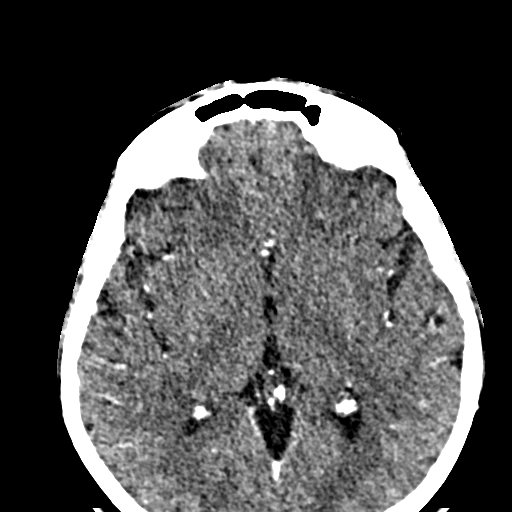
[im 73/78  bone]
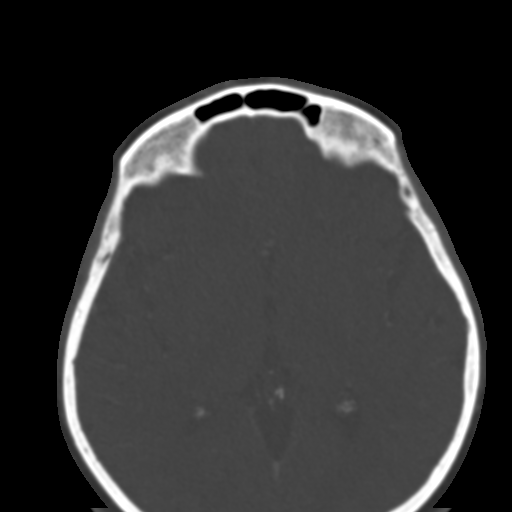

[Series 6: coronal soft · coronal · 0.31mm/px · 3 of 58 slices shown]
[im 20/58  bone]
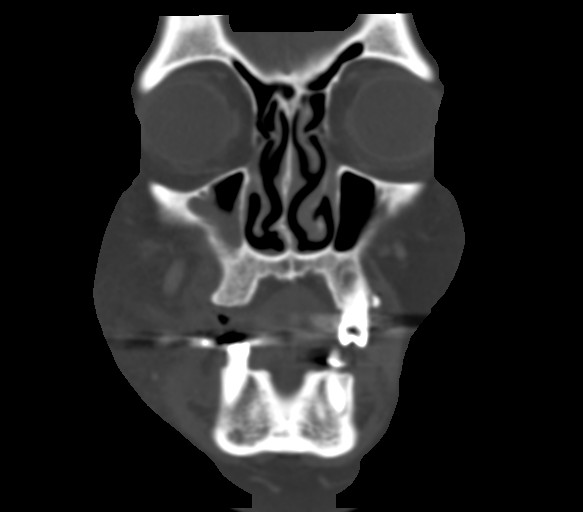
[im 26/58  bone]
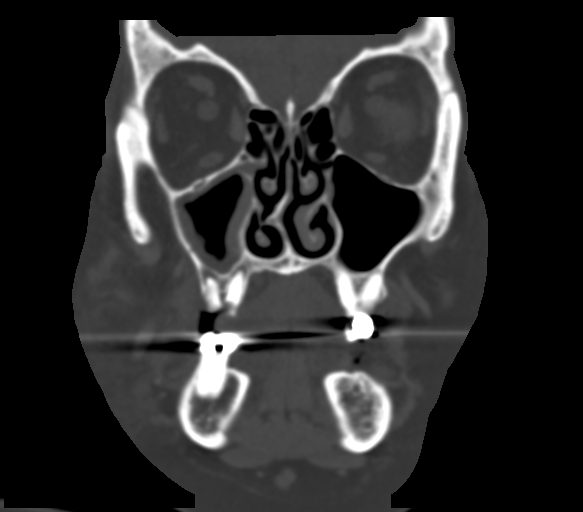
[im 32/58  bone]
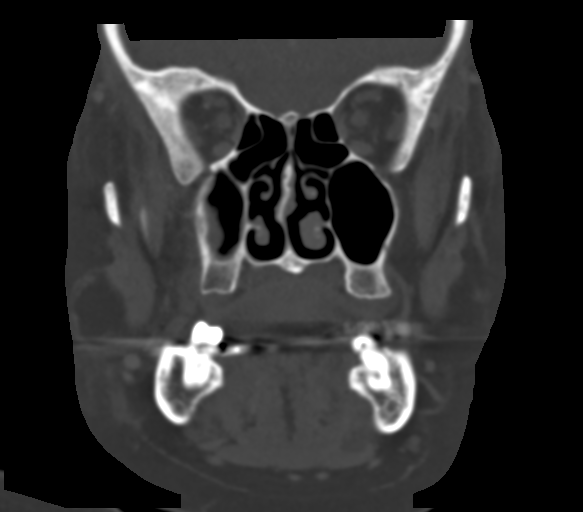

[Series 7: sagittal soft · sagittal · 0.27mm/px · 3 of 86 slices shown]
[im 29/86  bone]
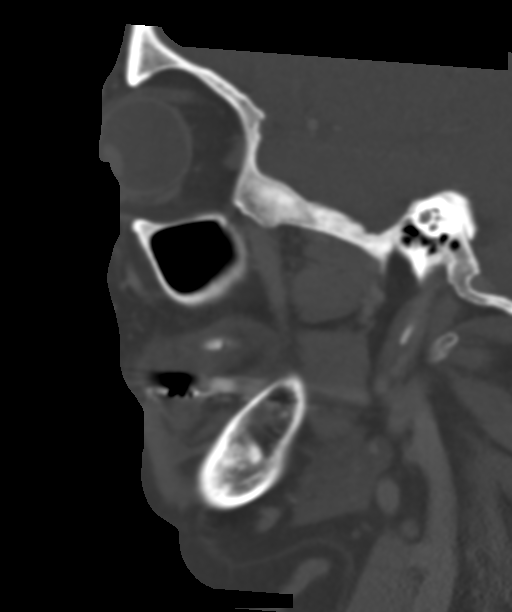
[im 43/86  bone]
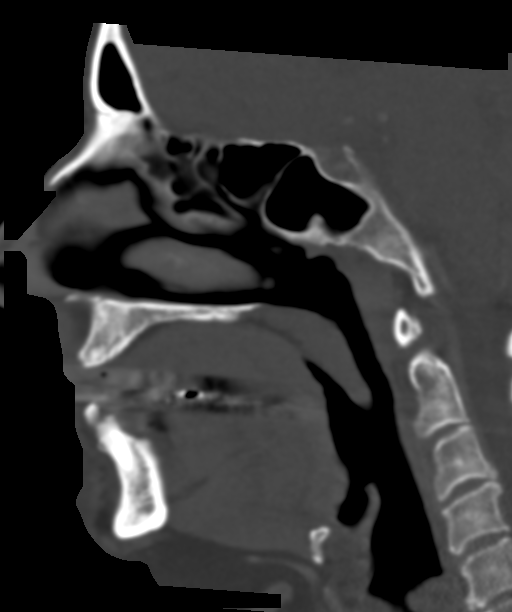
[im 57/86  bone]
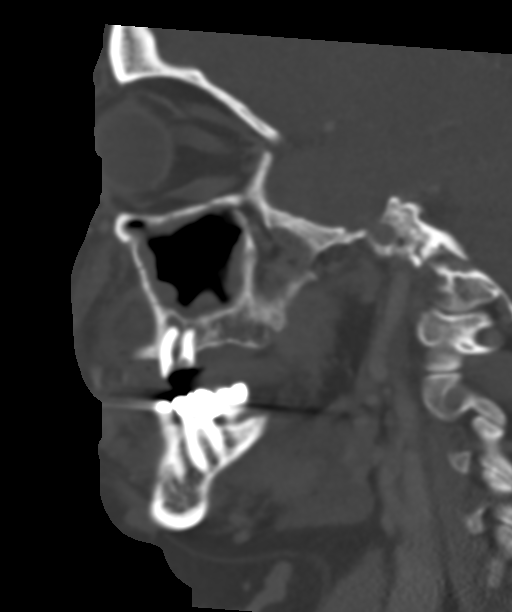

[15 of 47 positions shown; findings below may reference images not displayed]

FINDINGS: Mild stranding in the right facial soft tissues compatible with
cellulitis. No focal fluid collection to suggest soft tissue
abscess. Mucosal thickening in the right maxillary sinus. Paranasal
sinuses otherwise clear. Mastoid air cells are clear.

Lucency noted around bilateral upper premolars and the right upper
remaining molar compatible periapical abscess. Multiple dental
caries.
IMPRESSION: Periapical abscesses involving the upper premolars and remaining
right upper molar.

Multiple dental caries.

Cellulitis changes in the right face without soft tissue abscess.

## 2023-08-25 ENCOUNTER — Ambulatory Visit: Admitting: Family Medicine
